# Patient Record
Sex: Male | Born: 1970 | Race: White | Hispanic: No | Marital: Single | Smoking: Former smoker
Health system: Southern US, Community
[De-identification: ages and names within clinical notes are randomized; demographics above are authoritative.]

## PROBLEM LIST (undated history)

## (undated) DIAGNOSIS — A770 Spotted fever due to Rickettsia rickettsii: Secondary | ICD-10-CM

## (undated) HISTORY — PX: TONSILLECTOMY: SUR1361

## (undated) HISTORY — PX: APPENDECTOMY: SHX54

## (undated) HISTORY — PX: FRACTURE SURGERY: SHX138

---

## 2014-11-29 ENCOUNTER — Observation Stay
Admit: 2014-11-29 | Disposition: A | Discharge status: Home / Self Care | Payer: Self-pay | Attending: Surgery | Admitting: Surgery

## 2014-11-29 LAB — URINALYSIS, COMPLETE
BACTERIA: NONE SEEN
Bilirubin,UR: NEGATIVE
Blood: NEGATIVE
GLUCOSE, UR: NEGATIVE mg/dL (ref 0–75)
KETONE: NEGATIVE
Leukocyte Esterase: NEGATIVE
Nitrite: NEGATIVE
Ph: 5 (ref 4.5–8.0)
Protein: NEGATIVE
SPECIFIC GRAVITY: 1.018 (ref 1.003–1.030)
Squamous Epithelial: NONE SEEN

## 2014-11-29 LAB — CBC
HCT: 44.4 % (ref 40.0–52.0)
HGB: 14.7 g/dL (ref 13.0–18.0)
MCH: 30.1 pg (ref 26.0–34.0)
MCHC: 33.1 g/dL (ref 32.0–36.0)
MCV: 91 fL (ref 80–100)
Platelet: 234 10*3/uL (ref 150–440)
RBC: 4.89 10*6/uL (ref 4.40–5.90)
RDW: 13.3 % (ref 11.5–14.5)
WBC: 12 10*3/uL — ABNORMAL HIGH (ref 3.8–10.6)

## 2014-11-29 LAB — COMPREHENSIVE METABOLIC PANEL
ALK PHOS: 65 U/L
Albumin: 4.6 g/dL
Anion Gap: 6 — ABNORMAL LOW (ref 7–16)
BUN: 13 mg/dL
Bilirubin,Total: 0.3 mg/dL
CALCIUM: 9.1 mg/dL
CO2: 28 mmol/L
Chloride: 105 mmol/L
Creatinine: 0.87 mg/dL
EGFR (Non-African Amer.): >60
Glucose: 130 mg/dL — ABNORMAL HIGH
Potassium: 3.9 mmol/L
SGOT(AST): 23 U/L
SGPT (ALT): 24 U/L
SODIUM: 139 mmol/L
Total Protein: 7.8 g/dL

## 2014-11-29 LAB — TROPONIN I: Troponin-I: <0.03 ng/mL

## 2014-11-29 LAB — LIPASE, BLOOD: LIPASE: 46 U/L

## 2014-12-14 LAB — SURGICAL PATHOLOGY

## 2014-12-20 NOTE — Op Note (Signed)
PATIENT NAME:  ESTANISLADO, SURGEON MR#:  [id number] DATE OF BIRTH:  July 14, 1971  DATE OF PROCEDURE:  11/29/2014  PREOPERATIVE DIAGNOSIS:  Acute appendicitis.   POSTOPERATIVE DIAGNOSIS:  Acute appendicitis.   PROCEDURE PERFORMED:  Laparoscopic appendectomy.   SURGEON:  Sherri Rad, M.D.   ASSISTANTS:  Scrub tech.  ESTIMATED BLOOD LOSS:  Minimal.   DESCRIPTION OF PROCEDURE:  With informed consent, in supine position, and general endotracheal anesthesia, the patient's abdomen was widely clipped of hair, sterilely prepped and draped with ChloraPrep solution.  A timeout was observed.   A 12 mm blunt Hassan trocar was placed through an open technique through an infraumbilical transversely oriented skin incision with stay sutures being passed through the fascia. Pneumoperitoneum was established.  Limited laparoscopic evaluation of the abdomen demonstrated no findings in the upper abdomen.  Gallbladder, liver, and stomach appeared to be normal.  The appendix was moderately inflamed and swollen.  There was no evidence of perforation.  A 5 mm bladeless trocar was placed in the right upper quadrant.  The patient was then positioned right side up and in Trendelenburg position.  Left lower abdomen, 5 mm trocar was placed.  The appendix was grabbed along its midportion and elevated towards the anterior abdominal wall and the mesoappendix was divided with the Harmonic Scalpel apparatus.  A window was fashioned between the mesentery and the base of the appendix.  The base of the appendix was transected with two fires of the endoscopic GIA stapler with a lip of cecum.  The specimen was captured in an Endo Catch device and retrieved.  Pneumoperitoneum was then re-established.  The right lower quadrant was irrigated with 250 mL of normal saline and aspirated dry.  Hemostasis appeared to be excellent on the operative field.  A 5 mm camera was used for the extraction procedure and demonstrated no evidence of injury to bowel  from the umbilical trocar site insertion. With lap and needle count correct x2, the ports were then removed. Pneumoperitoneum was released.  A total of 30 mL of 0.25% plain Marcaine was infiltrated along all skin and fascial incisions prior to closure.   The infraumbilical fascial defect was reapproximated with an additional figure-of-eight #0 Vicryl suture in vertical orientation.  The existing stay sutures were tied to each other.  4-0 Vicryl subcuticular was applied to all skin edges followed by benzoin, Steri-Strips, Telfa and Tegaderm.  The patient was then subsequently extubated and taken to the recovery room in stable and satisfactory condition by anesthesia services.     ____________________________ Jeannette How Marina Gravel, MD mab:kc D: 11/29/2014 21:39:00 ET T: 11/29/2014 22:01:21 ET JOB#: [id number]  cc: Elta Guadeloupe A. Marina Gravel, MD, <Dictator> Hortencia Conradi MD ELECTRONICALLY SIGNED 11/30/2014 5:38

## 2014-12-20 NOTE — H&P (Signed)
Subjective/Chief Complaint abdominal pain, nausea   History of Present Illness 44 y.o health active male awoke this am with vague upper and lower abdominal pain followed by nausea and emesis with some anorexia. No prior symptoms of such, no prior abdominal operations no dysuria. no sick contacts   Past History none   Past Medical Health Non-Contributory, none.   Primary Physician none   Code Status Full Code   Past Med/Surgical Hx:  denies:   ALLERGIES:  No Known Allergies:   Family and Social History:  Family History Negative   Social History negative tobacco, negative ETOH, negative Illicit drugs   Review of Systems:  Subjective/Chief Complaint see above, remaining 10 point ros is negative.   Fever/Chills No   Cough No  nasal congestion.   Abdominal Pain Yes   Diarrhea No   Constipation No   Nausea/Vomiting Yes   Medications/Allergies Reviewed Medications/Allergies reviewed   Physical Exam:  GEN no acute distress, disheveled   HEENT pale conjunctivae, PERRL, moist oral mucosa   NECK supple   RESP normal resp effort  clear BS   CARD regular rate   ABD positive tenderness  tender in the RLQ and upper mid abdomen, negative rovsing's   LYMPH negative neck   EXTR negative cyanosis/clubbing   SKIN normal to palpation   NEURO cranial nerves intact   PSYCH A+O to time, place, person, good insight   Lab Results: Hepatic:  10-Apr-16 13:51   Bilirubin, Total 0.3 (0.3-1.2 NOTE: New Reference Range  10/27/14)  Alkaline Phosphatase 65 (38-126 NOTE: New Reference Range  10/27/14)  SGPT (ALT) 24 (17-63 NOTE: New Reference Range  10/27/14)  SGOT (AST) 23 (15-41 NOTE: New Reference Range  10/27/14)  Total Protein, Serum 7.8 (6.5-8.1 NOTE: New Reference Range  10/27/14)  Albumin, Serum 4.6 (3.5-5.0 NOTE: New reference range  10/27/14)  Routine Chem:  10-Apr-16 13:51   Glucose, Serum  130 (65-99 NOTE: New Reference Range  10/27/14)  BUN  13 (6-20 NOTE: New Reference Range  10/27/14)  Creatinine (comp) 0.87 (0.61-1.24 NOTE: New Reference Range  10/27/14)  Sodium, Serum 139 (135-145 NOTE: New Reference Range  10/27/14)  Potassium, Serum 3.9 (3.5-5.1 NOTE: New Reference Range  10/27/14)  Chloride, Serum 105 (101-111 NOTE: New Reference Range  10/27/14)  CO2, Serum 28 (22-32 NOTE: New Reference Range  10/27/14)  Calcium (Total), Serum 9.1 (8.9-10.3 NOTE: New Reference Range  10/27/14)  eGFR (African American) >60  eGFR (Non-African American) >60 (eGFR values <56m/min/1.73 m2 may be an indication of chronic kidney disease (CKD). Calculated eGFR is useful in patients with stable renal function. The eGFR calculation will not be reliable in acutely ill patients when serum creatinine is changing rapidly. It is not useful in patients on dialysis. The eGFR calculation may not be applicable to patients at the low and high extremes of body sizes, pregnant women, and vegetarians.)  Anion Gap  6  Lipase 46 (22-51 NOTE: New Reference Range  10/27/14)  Routine UA:  10-Apr-16 13:51   Color (UA) Yellow  Clarity (UA) Clear  Glucose (UA) Negative  Bilirubin (UA) Negative  Ketones (UA) Negative  Specific Gravity (UA) 1.018  Blood (UA) Negative  pH (UA) 5.0  Protein (UA) Negative  Nitrite (UA) Negative  Leukocyte Esterase (UA) Negative (Result(s) reported on 29 Nov 2014 at 02:51PM.)  RBC (UA) 0-5  WBC (UA) 0-5  Bacteria (UA) NONE SEEN  Epithelial Cells (UA) NONE SEEN  Result(s) reported on 29 Nov 2014 at 02:51PM.  Routine Hem:  10-Apr-16 13:51   WBC (CBC)  12.0  RBC (CBC) 4.89  Hemoglobin (CBC) 14.7  Hematocrit (CBC) 44.4  Platelet Count (CBC) 234 (Result(s) reported on 29 Nov 2014 at 02:27PM.)  MCV 91  MCH 30.1  MCHC 33.1  RDW 13.3   Radiology Results: XRay:    10-Apr-16 14:22, Abdomen 3 Way Includes PA Chest  Abdomen 3 Way Includes PA Chest  REASON FOR EXAM:    abd pain  COMMENTS:       PROCEDURE:  DXR - DXR ABDOMEN 3-WAY (INCL PA CXR)  - Nov 29 2014  2:22PM     CLINICAL DATA:  Abdominal pain starting 8 a.m.    EXAM:  ABDOMEN SERIES    COMPARISON:  None.    FINDINGS:  There is no evidenceof dilated bowel loops or free intraperitoneal  air. No radiopaque calculi or other significant radiographic  abnormality is seen. Heart size and mediastinal contours are within  normal limits. Both lungs are clear. Moderate stool noted in right  colon.     IMPRESSION:  Normal small bowel gas pattern. Moderate stool noted in right colon.  No acute cardiopulmonary disease.      Electronically Signed    By: Lahoma Crocker M.D.    On: 11/29/2014 14:56         Verified By: Ephraim Hamburger, M.D.,  LabUnknown:  PACS Image    10-Apr-16 17:36, CT Abdomen and Pelvis With Contrast  PACS Image  CT:  CT Abdomen and Pelvis With Contrast  REASON FOR EXAM:    (1) abd pain; (2) pel pain  COMMENTS:       PROCEDURE: CT  - CT ABDOMEN / PELVIS  W  - Nov 29 2014  5:36PM     CLINICAL DATA:  Epigastric pain and vomiting.    EXAM:  CT ABDOMEN AND PELVIS WITH CONTRAST    TECHNIQUE:  Multidetector CT imaging of the abdomen and pelvis was performed  using the standard protocol following bolus administration of  intravenous contrast.  CONTRAST:  100 cc of Omnipaque 300    COMPARISON:  None.    FINDINGS:  Lower chest: There is no pleural effusion or pericardial effusion  noted. The lung bases appear clear.    Hepatobiliary: 8 mm cyst in the left lobe of liver noted. There is  mild hepatic steatosis noted. The gallbladder appears within normal  limits.    Pancreas: Neck    Spleen: Negative  Adrenals/Urinary Tract: The adrenal glands are not both negative.  Small stone in the right mid kidney measures 3 mm, image 34/ series  2. Punctate stone is identified within the inferior pole of the left  kidney measuring 2-3 mm. Cyst within the upperpole of the left  kidney is noted measuring 1.8 cm. No  hydronephrosis identified. No  ureteral lithiasis identified. No stones identified within the  urinary bladder.    Stomach/Bowel: Small hiatal hernia. The small bowel loops have  appear normal. The appendix is abnormally thickened and there is  mild surrounding fat stranding. Several appendicular it is  identified. No evidence for perforation or abscess. Normal  appearance of the colon.    Vascular/Lymphatic: Calcified atherosclerotic diseaseinvolves the  abdominal aorta. No aneurysm. No enlarged retroperitoneal or  mesenteric adenopathy. No enlarged pelvic or inguinal lymph nodes.    Reproductive: Prostate gland and seminal vesicles are unremarkable.    Other: There is no ascites or focal fluid collections within the  abdomen or pelvis.  Bilateral fat containing inguinal hernias noted.    Musculoskeletal: There is evidence of avascular necrosis involving  the right hip.     IMPRESSION:  1. Findings consistent with acute appendicitis.No complications  identified. No perforation or abscess.  2. Nonobstructing renal calculi.  3. Renal cysts.  4. Right hip avascular necrosis.  5. Atherosclerotic disease.      Electronically Signed    By: Kerby Moors M.D.    On: 11/29/2014 18:16         Verified By: Angelita Ingles, M.D.,    Assessment/Admission Diagnosis 44 y.o male with early acute appendicitis.   Plan admit, lap appendectomy, discussed with him and S.O. risks of surgery and anticipated ;post-op recovery. zosyn and lovenox pre-op orders written.   Electronic Signatures: Sherri Rad (MD)  (Signed 10-Apr-16 19:55)  Authored: CHIEF COMPLAINT and HISTORY, PAST MEDICAL/SURGIAL HISTORY, ALLERGIES, FAMILY AND SOCIAL HISTORY, REVIEW OF SYSTEMS, PHYSICAL EXAM, LABS, Radiology, ASSESSMENT AND PLAN   Last Updated: 10-Apr-16 19:55 by Sherri Rad (MD)

## 2015-01-21 ENCOUNTER — Other Ambulatory Visit: Payer: Self-pay | Admitting: Orthopedic Surgery

## 2015-01-21 DIAGNOSIS — M4800 Spinal stenosis, site unspecified: Secondary | ICD-10-CM

## 2015-01-21 DIAGNOSIS — M79604 Pain in right leg: Secondary | ICD-10-CM

## 2015-01-28 ENCOUNTER — Ambulatory Visit: Payer: 59

## 2015-02-19 DIAGNOSIS — A770 Spotted fever due to Rickettsia rickettsii: Secondary | ICD-10-CM

## 2015-02-19 HISTORY — DX: Spotted fever due to Rickettsia rickettsii: A77.0

## 2015-06-14 ENCOUNTER — Other Ambulatory Visit: Payer: 59

## 2015-06-14 ENCOUNTER — Encounter: Payer: Self-pay | Admitting: *Deleted

## 2015-06-14 NOTE — Patient Instructions (Signed)
  Your procedure is scheduled on: 06-22-15 Report to [place] To find out your arrival time please call [phone] between 1PM - 3PM on 06-21-15  Remember: Instructions that are not followed completely may result in serious medical risk, up to and including death, or upon the discretion of your surgeon and anesthesiologist your surgery may need to be rescheduled.    _X___ 1. Do not eat food or drink liquids after midnight. No gum chewing or hard candies.     _X___ 2. No Alcohol for 24 hours before or after surgery.   ____ 3. Bring all medications with you on the day of surgery if instructed.    ____ 4. Notify your doctor if there is any change in your medical condition     (cold, fever, infections).     Do not wear jewelry, make-up, hairpins, clips or nail polish.  Do not wear lotions, powders, or perfumes. You may wear deodorant.  Do not shave 48 hours prior to surgery. Men may shave face and neck.  Do not bring valuables to the hospital.    [facility] is not responsible for any belongings or valuables.               Contacts, dentures or bridgework may not be worn into surgery.  Leave your suitcase in the car. After surgery it may be brought to your room.  For patients admitted to the hospital, discharge time is determined by your treatment team.   Patients discharged the day of surgery will not be allowed to drive home.   Please read over the following fact sheets that you were given:      ____ Take these medicines the morning of surgery with A SIP OF WATER:    1. NONE  2.   3.   4.  5.  6.  ____ Fleet Enema (as directed)   ____ Use CHG Soap as directed  ____ Use inhalers on the day of surgery  ____ Stop metformin 2 days prior to surgery    ____ Take 1/2 of usual insulin dose the night before surgery and none on the morning of surgery.   ____ Stop Coumadin/Plavix/aspirin-N/A  _X___ Stop Anti-inflammatories-STOP MELOXICAM 48  HOURS PRIOR TO SURGERY PER DR SMITH-TYLENOL OK    ____ Stop supplements until after surgery.    ____ Bring C-Pap to the hospital.

## 2015-06-22 ENCOUNTER — Ambulatory Visit
Admission: RE | Admit: 2015-06-22 | Discharge: 2015-06-22 | Disposition: A | Discharge status: Home / Self Care | Payer: 59 | Source: Ambulatory Visit | Attending: Surgery | Admitting: Surgery

## 2015-06-22 ENCOUNTER — Encounter
Admission: RE | Disposition: A | Discharge status: Home / Self Care | Payer: Self-pay | Source: Ambulatory Visit | Attending: Surgery

## 2015-06-22 ENCOUNTER — Ambulatory Visit: Payer: 59 | Admitting: Anesthesiology

## 2015-06-22 DIAGNOSIS — Z9889 Other specified postprocedural states: Secondary | ICD-10-CM | POA: Insufficient documentation

## 2015-06-22 DIAGNOSIS — K4090 Unilateral inguinal hernia, without obstruction or gangrene, not specified as recurrent: Secondary | ICD-10-CM | POA: Insufficient documentation

## 2015-06-22 DIAGNOSIS — D176 Benign lipomatous neoplasm of spermatic cord: Secondary | ICD-10-CM | POA: Diagnosis not present

## 2015-06-22 DIAGNOSIS — Z9049 Acquired absence of other specified parts of digestive tract: Secondary | ICD-10-CM | POA: Insufficient documentation

## 2015-06-22 DIAGNOSIS — Z791 Long term (current) use of non-steroidal anti-inflammatories (NSAID): Secondary | ICD-10-CM | POA: Insufficient documentation

## 2015-06-22 HISTORY — DX: Spotted fever due to Rickettsia rickettsii: A77.0

## 2015-06-22 HISTORY — PX: INGUINAL HERNIA REPAIR: SHX194

## 2015-06-22 SURGERY — REPAIR, HERNIA, INGUINAL, ADULT
Anesthesia: General | Laterality: Right

## 2015-06-22 MED ORDER — GLYCOPYRROLATE 0.2 MG/ML IJ SOLN
INTRAMUSCULAR | Status: DC | PRN
  Administered 2015-06-22: 0.2 mg via INTRAVENOUS

## 2015-06-22 MED ORDER — FAMOTIDINE 20 MG PO TABS
20.0000 mg | ORAL_TABLET | Freq: Once | ORAL | Status: AC
  Administered 2015-06-22: 20 mg via ORAL

## 2015-06-22 MED ORDER — BUPIVACAINE-EPINEPHRINE (PF) 0.5% -1:200000 IJ SOLN
INTRAMUSCULAR | Status: DC | PRN
  Administered 2015-06-22: 20 mL

## 2015-06-22 MED ORDER — FAMOTIDINE 20 MG PO TABS
ORAL_TABLET | ORAL | Status: AC
  Administered 2015-06-22: 20 mg via ORAL
  Filled 2015-06-22: qty 1

## 2015-06-22 MED ORDER — LIDOCAINE HCL (CARDIAC) 20 MG/ML IV SOLN
INTRAVENOUS | Status: DC | PRN
  Administered 2015-06-22: 100 mg via INTRAVENOUS

## 2015-06-22 MED ORDER — CEFAZOLIN SODIUM 1-5 GM-% IV SOLN
1.0000 g | Freq: Once | INTRAVENOUS | Status: AC
  Administered 2015-06-22 (×2): 1 g via INTRAVENOUS

## 2015-06-22 MED ORDER — LACTATED RINGERS IV SOLN
INTRAVENOUS | Status: DC
  Administered 2015-06-22 (×2): via INTRAVENOUS

## 2015-06-22 MED ORDER — FENTANYL CITRATE (PF) 100 MCG/2ML IJ SOLN
INTRAMUSCULAR | Status: AC
  Administered 2015-06-22: 25 ug via INTRAVENOUS
  Filled 2015-06-22: qty 2

## 2015-06-22 MED ORDER — HYDROCODONE-ACETAMINOPHEN 5-325 MG PO TABS
ORAL_TABLET | ORAL | Status: AC
  Filled 2015-06-22: qty 1

## 2015-06-22 MED ORDER — FENTANYL CITRATE (PF) 100 MCG/2ML IJ SOLN
INTRAMUSCULAR | Status: DC | PRN
  Administered 2015-06-22 (×3): 25 ug via INTRAVENOUS
  Administered 2015-06-22: 50 ug via INTRAVENOUS
  Administered 2015-06-22: 25 ug via INTRAVENOUS
  Administered 2015-06-22 (×2): 50 ug via INTRAVENOUS

## 2015-06-22 MED ORDER — CEFAZOLIN SODIUM 1-5 GM-% IV SOLN
INTRAVENOUS | Status: AC
  Administered 2015-06-22: 1 g via INTRAVENOUS
  Filled 2015-06-22: qty 50

## 2015-06-22 MED ORDER — HYDROCODONE-ACETAMINOPHEN 5-325 MG PO TABS
ORAL_TABLET | ORAL | Status: AC
  Administered 2015-06-22: 1 via ORAL
  Filled 2015-06-22: qty 1

## 2015-06-22 MED ORDER — ONDANSETRON HCL 4 MG/2ML IJ SOLN
INTRAMUSCULAR | Status: DC | PRN
Start: 2015-06-22 — End: 2015-06-22
  Administered 2015-06-22: 4 mg via INTRAVENOUS

## 2015-06-22 MED ORDER — KETOROLAC TROMETHAMINE 30 MG/ML IJ SOLN: 30.0000 mg | Freq: Once | INTRAMUSCULAR | Status: DC

## 2015-06-22 MED ORDER — PROPOFOL 10 MG/ML IV BOLUS
INTRAVENOUS | Status: DC | PRN
  Administered 2015-06-22: 20 mg via INTRAVENOUS
  Administered 2015-06-22: 200 mg via INTRAVENOUS

## 2015-06-22 MED ORDER — MIDAZOLAM HCL 2 MG/2ML IJ SOLN
INTRAMUSCULAR | Status: DC | PRN
  Administered 2015-06-22: 2 mg via INTRAVENOUS

## 2015-06-22 MED ORDER — DEXMEDETOMIDINE HCL IN NACL 200 MCG/50ML IV SOLN
INTRAVENOUS | Status: DC | PRN
  Administered 2015-06-22: 8 ug via INTRAVENOUS

## 2015-06-22 MED ORDER — DEXAMETHASONE SODIUM PHOSPHATE 4 MG/ML IJ SOLN
INTRAMUSCULAR | Status: DC | PRN
  Administered 2015-06-22: 10 mg via INTRAVENOUS

## 2015-06-22 MED ORDER — FENTANYL CITRATE (PF) 100 MCG/2ML IJ SOLN
25.0000 ug | INTRAMUSCULAR | Status: AC | PRN
  Administered 2015-06-22 (×6): 25 ug via INTRAVENOUS

## 2015-06-22 MED ORDER — PROMETHAZINE HCL 25 MG/ML IJ SOLN: 6.2500 mg | INTRAMUSCULAR | Status: DC | PRN

## 2015-06-22 MED ORDER — KETOROLAC TROMETHAMINE 60 MG/2ML IM SOLN
INTRAMUSCULAR | Status: AC
  Administered 2015-06-22: 30 mg
  Filled 2015-06-22: qty 2

## 2015-06-22 MED ORDER — SODIUM CHLORIDE 0.9 % IJ SOLN
INTRAMUSCULAR | Status: AC
  Filled 2015-06-22: qty 10

## 2015-06-22 MED ORDER — HYDROCODONE-ACETAMINOPHEN 5-325 MG PO TABS
1.0000 | ORAL_TABLET | ORAL | Status: DC | PRN
  Administered 2015-06-22 (×2): 1 via ORAL

## 2015-06-22 MED ORDER — HYDROCODONE-ACETAMINOPHEN 5-325 MG PO TABS: 1.0000 | ORAL_TABLET | ORAL | Status: AC | PRN

## 2015-06-22 MED ORDER — BUPIVACAINE-EPINEPHRINE (PF) 0.5% -1:200000 IJ SOLN
INTRAMUSCULAR | Status: AC
  Filled 2015-06-22: qty 30

## 2015-06-22 SURGICAL SUPPLY — 25 items
BLADE SURG 15 STRL LF DISP TIS (BLADE) ×1 IMPLANT
BLADE SURG 15 STRL SS (BLADE) ×2
CANISTER SUCT 1200ML W/VALVE (MISCELLANEOUS) ×3 IMPLANT
CHLORAPREP W/TINT 26ML (MISCELLANEOUS) ×3 IMPLANT
COVER LIGHT HANDLE STERIS (MISCELLANEOUS) ×3 IMPLANT
DRAIN PENROSE 5/8X18 LTX STRL (WOUND CARE) ×3 IMPLANT
DRAPE PED LAPAROTOMY (DRAPES) ×3 IMPLANT
GLOVE BIO SURGEON STRL SZ7.5 (GLOVE) ×21 IMPLANT
GOWN STRL REUS W/ TWL LRG LVL3 (GOWN DISPOSABLE) ×4 IMPLANT
GOWN STRL REUS W/TWL LRG LVL3 (GOWN DISPOSABLE) ×8
KIT RM TURNOVER STRD PROC AR (KITS) ×3 IMPLANT
LABEL OR SOLS (LABEL) ×3 IMPLANT
LIQUID BAND (GAUZE/BANDAGES/DRESSINGS) ×3 IMPLANT
MESH SYNTHETIC 4X6 SOFT BARD (Mesh General) ×1 IMPLANT
MESH SYNTHETIC SOFT BARD 4X6 (Mesh General) ×2 IMPLANT
NEEDLE HYPO 25X1 1.5 SAFETY (NEEDLE) ×3 IMPLANT
NS IRRIG 500ML POUR BTL (IV SOLUTION) ×3 IMPLANT
PACK BASIN MINOR ARMC (MISCELLANEOUS) ×3 IMPLANT
PAD GROUND ADULT SPLIT (MISCELLANEOUS) ×3 IMPLANT
SUT CHROMIC 4 0 RB 1X27 (SUTURE) ×3 IMPLANT
SUT MNCRL AB 4-0 PS2 18 (SUTURE) ×3 IMPLANT
SUT SURGILON 0 30 BLK (SUTURE) ×6 IMPLANT
SUT VIC AB 4-0 SH 27 (SUTURE) ×4
SUT VIC AB 4-0 SH 27XANBCTRL (SUTURE) ×2 IMPLANT
SYRINGE 10CC LL (SYRINGE) ×3 IMPLANT

## 2015-06-22 NOTE — Discharge Instructions (Addendum)
Take Tylenol or Norco if needed for pain.  May take Mobic as needed.  May shower.  Avoid straining and heavy lifting for 1 month.  AMBULATORY SURGERY  DISCHARGE INSTRUCTIONS   1) The drugs that you were given will stay in your system until tomorrow so for the next 24 hours you should not:  A) Drive an automobile B) Make any legal decisions C) Drink any alcoholic beverage   2) You may resume regular meals tomorrow.  Today it is better to start with liquids and gradually work up to solid foods.  You may eat anything you prefer, but it is better to start with liquids, then soup and crackers, and gradually work up to solid foods.   3) Please notify your doctor immediately if you have any unusual bleeding, trouble breathing, redness and pain at the surgery site, drainage, fever, or pain not relieved by medication.    4) Additional Instructions:        Please contact your physician with any problems or Same Day Surgery at [phone], Monday through Friday 6 am to 4 pm, or [place] [facility] number at [phone].

## 2015-06-22 NOTE — Transfer of Care (Signed)
Immediate Anesthesia Transfer of Care Note  Patient: Sean Lang  Procedure(s) Performed: Procedure(s): HERNIA REPAIR INGUINAL ADULT (Right)  Patient Location: PACU  Anesthesia Type:General  Level of Consciousness: sedated  Airway & Oxygen Therapy: Patient Spontanous Breathing and Patient connected to face mask oxygen  Post-op Assessment: Report given to RN and Post -op Vital signs reviewed and stable  Post vital signs: Reviewed and stable  Last Vitals:  Filed Vitals:   06/22/15 0935  BP: 147/96  Pulse: 90  Temp: 37.2 C  Resp: 17    Complications: No apparent anesthesia complications

## 2015-06-22 NOTE — Anesthesia Preprocedure Evaluation (Signed)
Anesthesia Evaluation  Patient identified by MRN, date of birth, ID band Patient awake    Reviewed: Allergy & Precautions, H&P , NPO status , Patient's Chart, lab work & pertinent test results, reviewed documented beta blocker date and time   History of Anesthesia Complications Negative for: history of anesthetic complications  Airway Mallampati: I  TM Distance: >3 FB Neck ROM: full    Dental no notable dental hx. (+) Caps   Pulmonary neg shortness of breath, neg sleep apnea, neg COPD, neg recent URI, former smoker,    Pulmonary exam normal breath sounds clear to auscultation       Cardiovascular Exercise Tolerance: Good negative cardio ROS Normal cardiovascular exam Rhythm:regular Rate:Normal     Neuro/Psych negative neurological ROS  negative psych ROS   GI/Hepatic negative GI ROS, Neg liver ROS,   Endo/Other  negative endocrine ROS  Renal/GU negative Renal ROS  negative genitourinary   Musculoskeletal   Abdominal   Peds  Hematology negative hematology ROS (+)   Anesthesia Other Findings Past Medical History:   [facility] spotted fever                    JULY 2016      Comment:treated with antibiotics   Reproductive/Obstetrics negative OB ROS                             Anesthesia Physical Anesthesia Plan  ASA: I  Anesthesia Plan: General   Post-op Pain Management:    Induction:   Airway Management Planned:   Additional Equipment:   Intra-op Plan:   Post-operative Plan:   Informed Consent: I have reviewed the patients History and Physical, chart, labs and discussed the procedure including the risks, benefits and alternatives for the proposed anesthesia with the patient or authorized representative who has indicated his/her understanding and acceptance.   Dental Advisory Given  Plan Discussed with: Anesthesiologist, CRNA and Surgeon  Anesthesia Plan Comments:          Anesthesia Quick Evaluation

## 2015-06-22 NOTE — H&P (Signed)
  He is here for right inguinal hernia repair.  He reports no change in condition since office visit.  Discussed plan for surgery and marked the right side YES

## 2015-06-22 NOTE — Op Note (Signed)
OPERATIVE REPORT  PREOPERATIVE DIAGNOSIS: right inguinal hernia  POSTOPERATIVE DIAGNOSIS:right  inguinal hernia  PROCEDURE:  right inguinal hernia repair  ANESTHESIA:  General  SURGEON:  Rochel Brome M.D.  INDICATIONS: He has had bulging in the right groin. A right inguinal hernia was demonstrated on physical exam.  With the patient on the operating table in the supine position the right lower quadrant was prepared with clippers and with ChloraPrep and draped in a sterile manner. A transversely oriented suprapubic incision was made and carried down through subcutaneous tissues. Electrocautery was used for hemostasis. The Scarpa's fascia was incised. The external oblique aponeurosis was incised along the course of its fibers to open the external ring and expose the inguinal cord structures. The cord structures were mobilized. A Penrose drain was passed around the cord structures for traction. Cremaster fibers were spread to expose a cord lipoma which was dissected free from surrounding structures and dissected up into the internal ring the cord lipoma was 6 cm in length a high ligation was done with a 0 Surgilon suture ligature and the cord lipoma was amputated. An indirect hernia sac was dissected free from surrounding structures and was  5 cm in length. A high ligation of the sac was done with a 0 Surgilon suture ligature. The sac was excised and the stump was allowed to retract. Another smaller cord lipoma was dissected free from surrounding structures and ligated with 4-0 Vicryl and amputated. No tissues were submitted for pathology. The floor of the inguinal canal was repaired with 0 Surgilon sutures suturing the conjoined tendon to the shelving edge of the inguinal ligament incorporating transversalis fascia into the repair the last stitch led to satisfactory narrowing of the internal ring Bard soft mesh was cut to create an oval shape and was placed over the repair. This was sutured to the  repair with interrupted 0 Surgilon sutures and also sutured medially to the deep fascia and on both sides of the internal ring. Next after seeing hemostasis was intact thecord structures were replaced along the floor of the inguinal canal. The cut edges of the external oblique aponeurosis were closed with a running 4-0 Vicryl suture to re-create the external ring. The deep fascia superior and lateral to the repair site was infiltrated with half percent Sensorcaine with epinephrine. Subcutaneous tissues were also infiltrated. The Scarpa's fascia was closed with interrupted 4-0 Vicryl sutures. The skin was closed with running 4-0 Monocryl subcuticular suture and LiquiBand. The testicle remained in the scrotum  The patient appeared to be in satisfactory condition and was prepared for transfer to the recovery room.  Rochel Brome M.D.

## 2015-06-27 NOTE — Anesthesia Postprocedure Evaluation (Signed)
  Anesthesia Post-op Note  Patient: Sean Lang  Procedure(s) Performed: Procedure(s): HERNIA REPAIR INGUINAL ADULT (Right)  Anesthesia type:General  Patient location: PACU  Post pain: Pain level controlled  Post assessment: Post-op Vital signs reviewed, Patient's Cardiovascular Status Stable, Respiratory Function Stable, Patent Airway and No signs of Nausea or vomiting  Post vital signs: Reviewed and stable  Last Vitals:  Filed Vitals:   06/22/15 1241  BP: 121/69  Pulse: 57  Temp:   Resp: 16    Level of consciousness: awake, alert  and patient cooperative  Complications: No apparent anesthesia complications

## 2015-08-19 IMAGING — CR DG ABDOMEN 3V
1 series · 4 of 4 positions shown · non-contrast
Comparison: None.

CLINICAL DATA: Abdominal pain starting 8 a.m.

EXAM:
ABDOMEN SERIES

[Series 1: dxr abdomen 3-way (incl pa cxr) · 0.14mm/px · 4 of 4 slices shown]
[im 1/4]
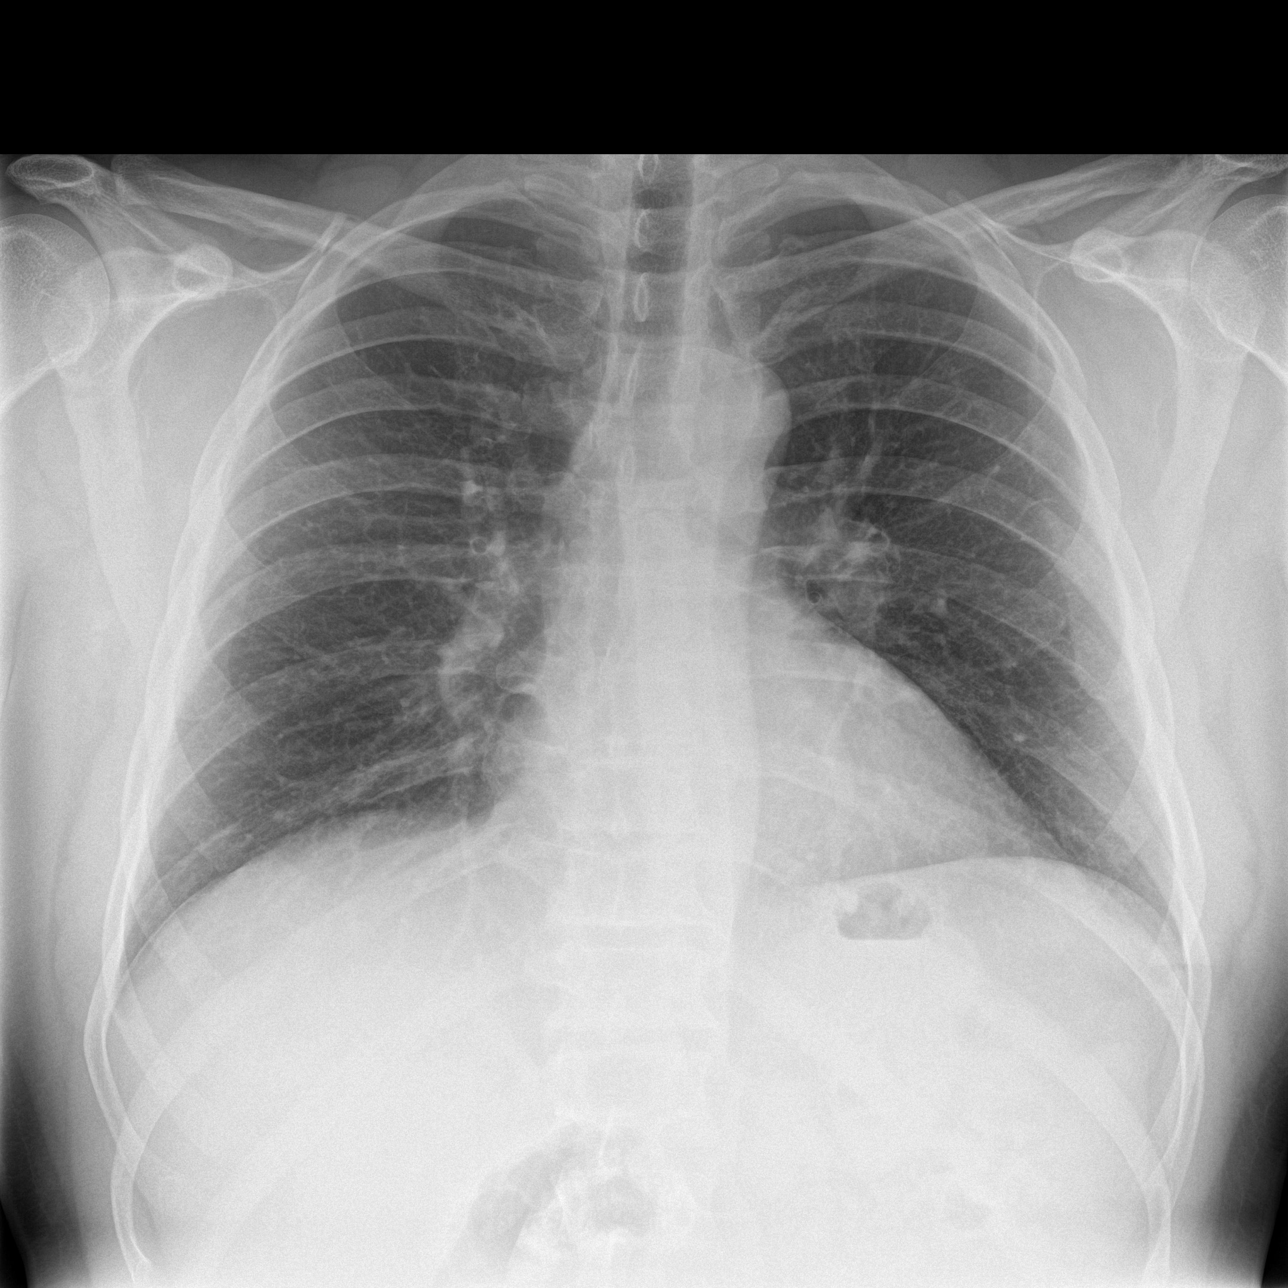
[im 2/4]
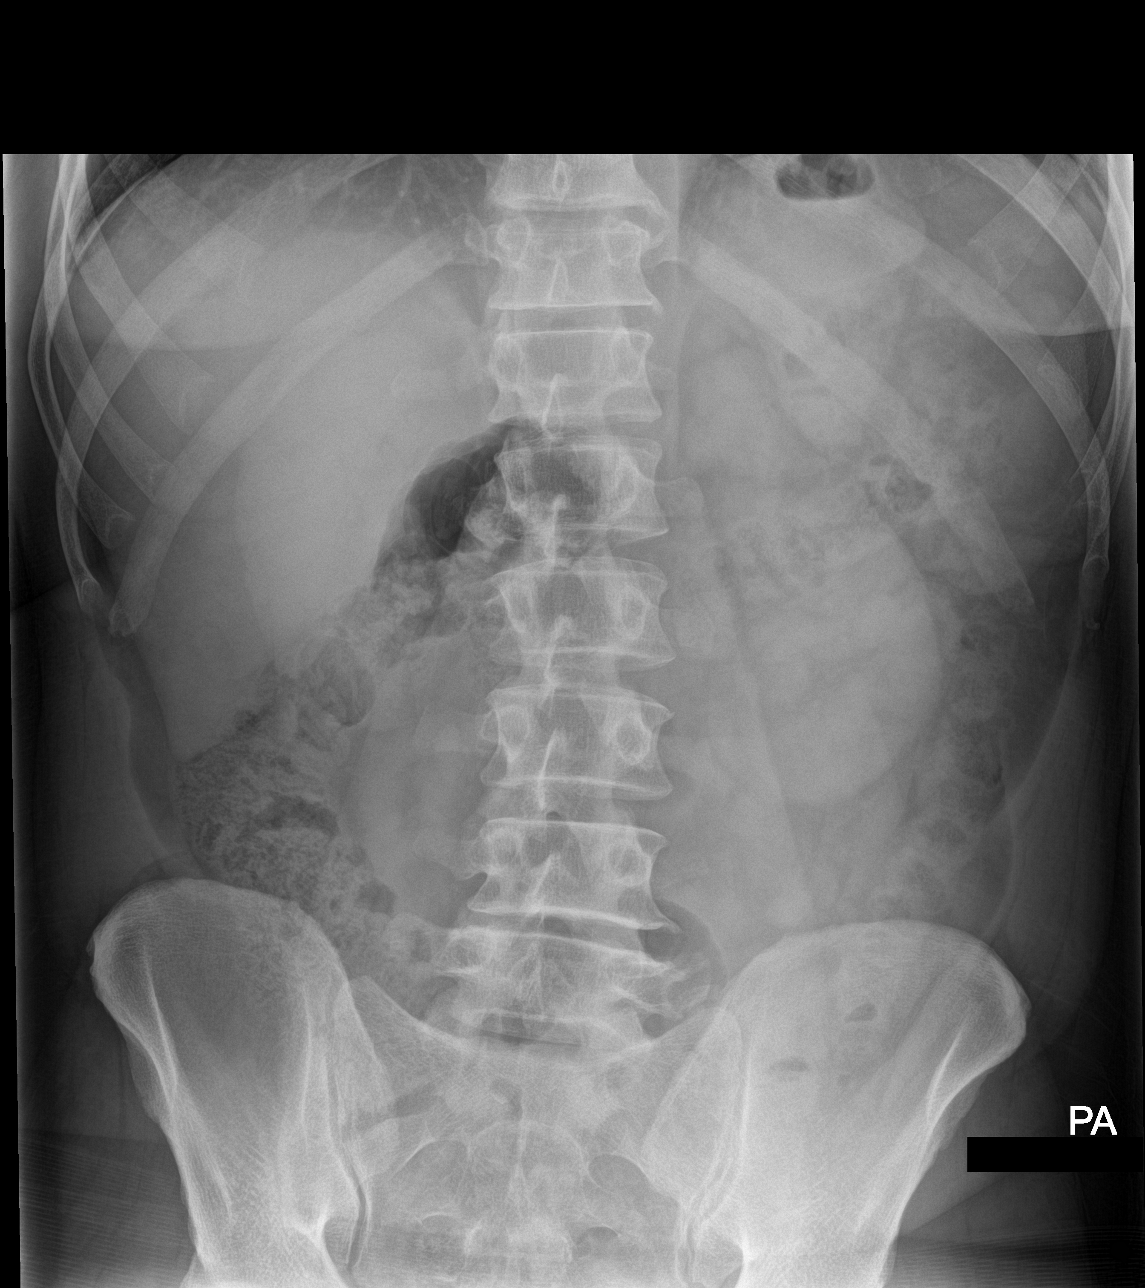
[im 3/4]
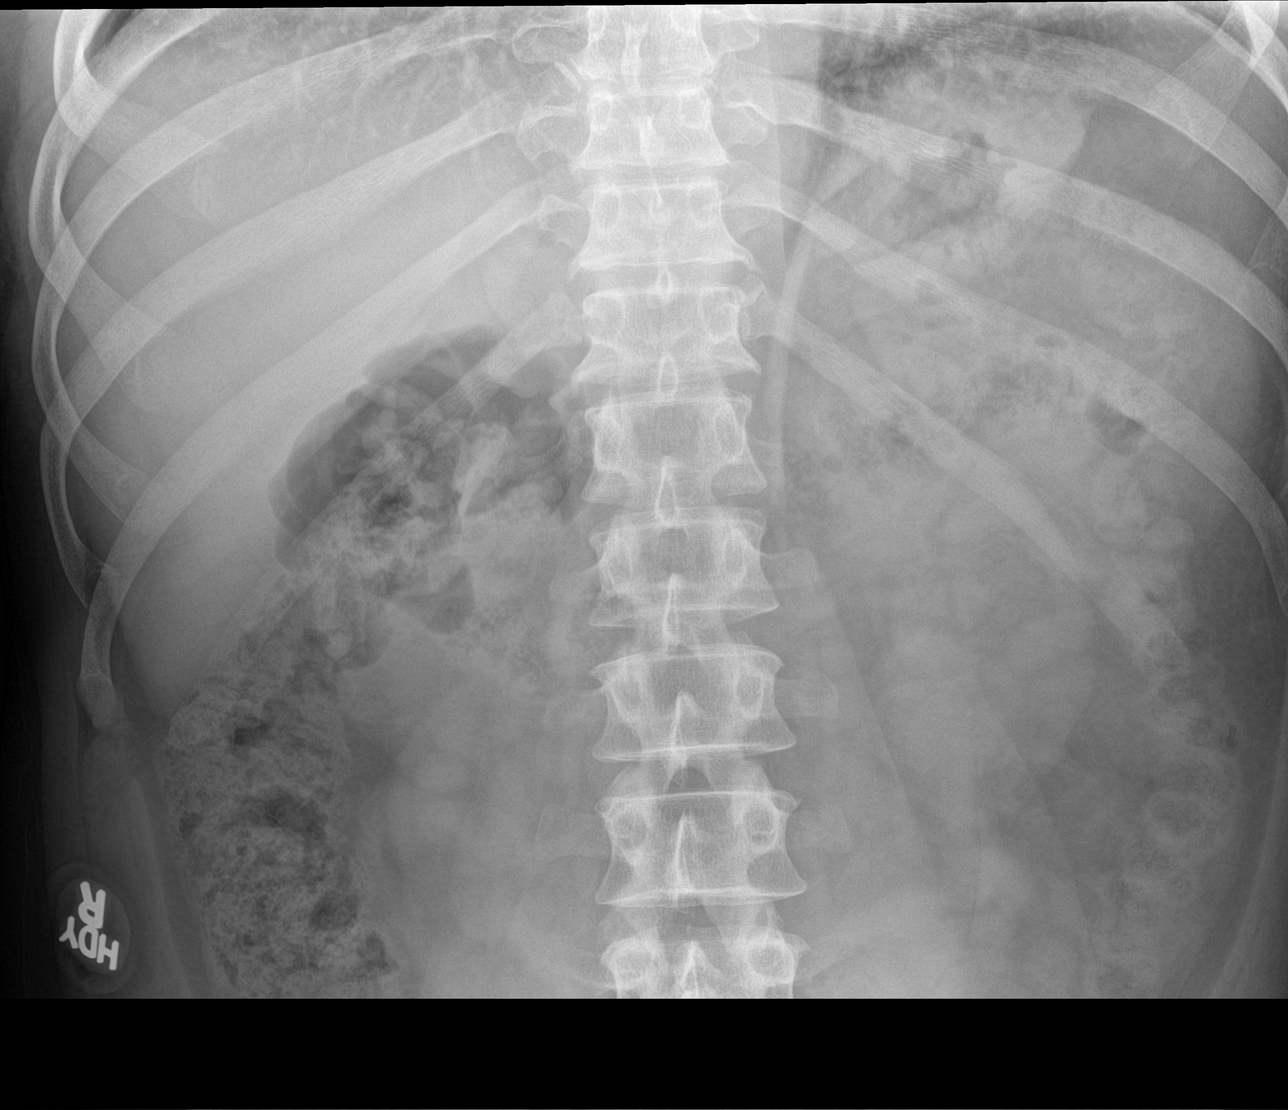
[im 4/4]
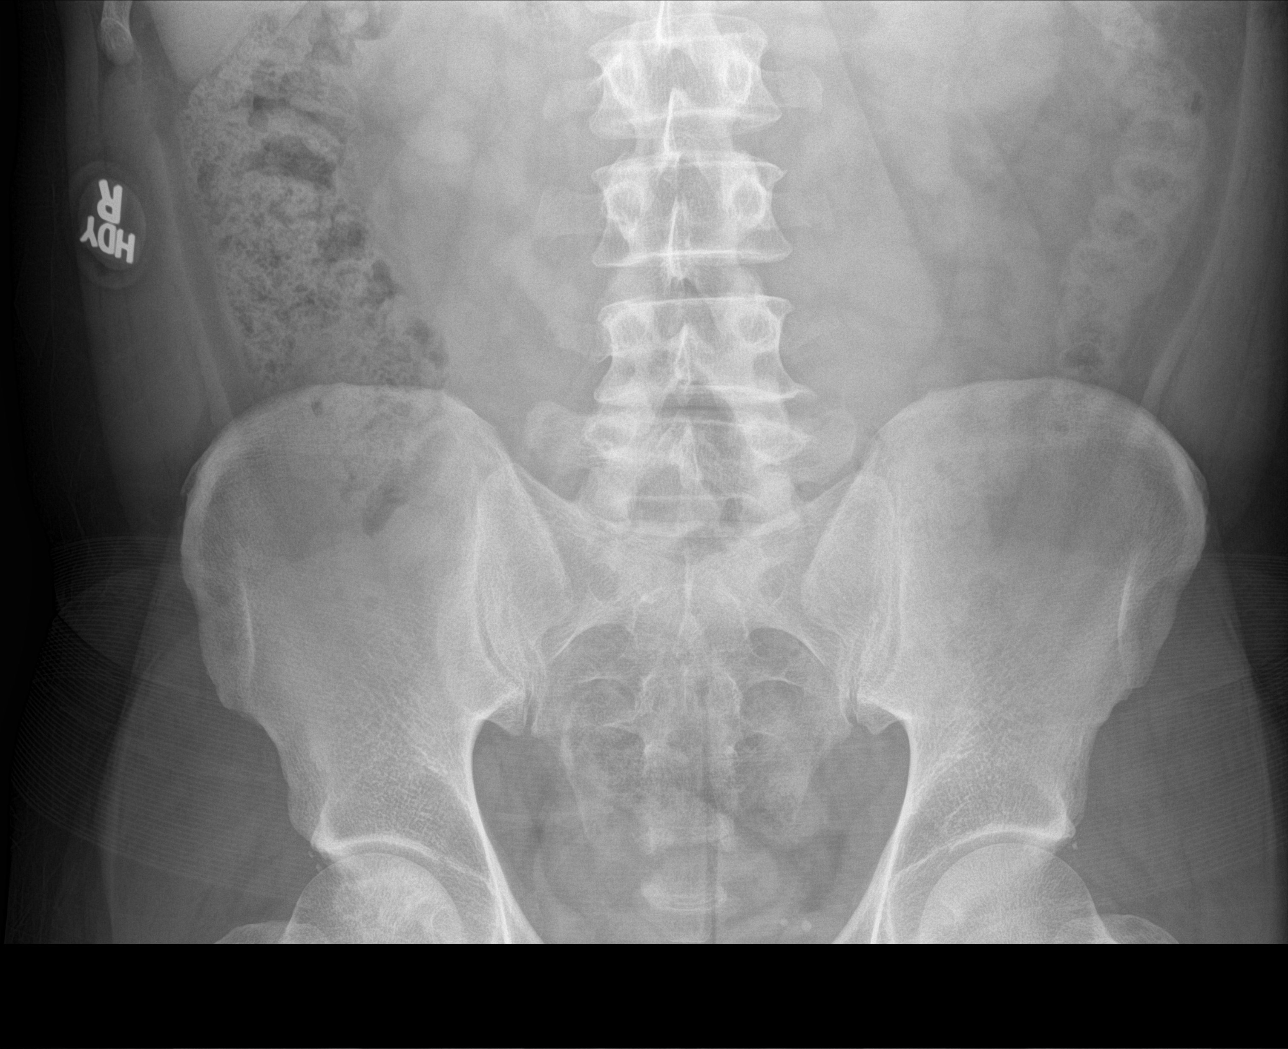

[4 of 4 positions shown; findings below may reference images not displayed]

FINDINGS: There is no evidence of dilated bowel loops or free intraperitoneal
air. No radiopaque calculi or other significant radiographic
abnormality is seen. Heart size and mediastinal contours are within
normal limits. Both lungs are clear. Moderate stool noted in right
colon.
IMPRESSION: Normal small bowel gas pattern. Moderate stool noted in right colon.
No acute cardiopulmonary disease.
